# Patient Record
Sex: Female | Born: 1945 | Race: Black or African American | Hispanic: No | Marital: Married
Health system: Southern US, Community
[De-identification: ages and names within clinical notes are randomized; demographics above are authoritative.]

## PROBLEM LIST (undated history)

## (undated) DIAGNOSIS — I10 Essential (primary) hypertension: Secondary | ICD-10-CM

## (undated) DIAGNOSIS — D72819 Decreased white blood cell count, unspecified: Secondary | ICD-10-CM

## (undated) DIAGNOSIS — E039 Hypothyroidism, unspecified: Secondary | ICD-10-CM

## (undated) HISTORY — DX: Hypothyroidism, unspecified: E03.9

## (undated) HISTORY — PX: BREAST EXCISIONAL BIOPSY: SUR124

## (undated) HISTORY — DX: Decreased white blood cell count, unspecified: D72.819

## (undated) HISTORY — DX: Essential (primary) hypertension: I10

---

## 2000-02-23 ENCOUNTER — Encounter: Admission: RE | Admit: 2000-02-23 | Discharge: 2000-02-23 | Payer: Self-pay | Admitting: Obstetrics and Gynecology

## 2000-02-23 ENCOUNTER — Encounter: Payer: Self-pay | Admitting: Obstetrics and Gynecology

## 2000-06-04 ENCOUNTER — Encounter: Payer: Self-pay | Admitting: Internal Medicine

## 2000-06-04 ENCOUNTER — Encounter: Admission: RE | Admit: 2000-06-04 | Discharge: 2000-06-04 | Payer: Self-pay | Admitting: Internal Medicine

## 2001-02-01 ENCOUNTER — Other Ambulatory Visit: Admission: RE | Admit: 2001-02-01 | Discharge: 2001-02-01 | Payer: Self-pay | Admitting: Obstetrics and Gynecology

## 2001-03-08 ENCOUNTER — Encounter: Admission: RE | Admit: 2001-03-08 | Discharge: 2001-03-08 | Payer: Self-pay | Admitting: Obstetrics and Gynecology

## 2001-03-08 ENCOUNTER — Encounter: Payer: Self-pay | Admitting: Obstetrics and Gynecology

## 2001-06-10 ENCOUNTER — Emergency Department (HOSPITAL_COMMUNITY): Admission: EM | Admit: 2001-06-10 | Discharge: 2001-06-11 | Payer: Self-pay | Admitting: Emergency Medicine

## 2001-06-11 ENCOUNTER — Encounter: Payer: Self-pay | Admitting: Emergency Medicine

## 2002-03-09 ENCOUNTER — Encounter: Admission: RE | Admit: 2002-03-09 | Discharge: 2002-03-09 | Payer: Self-pay | Admitting: Internal Medicine

## 2002-03-09 ENCOUNTER — Encounter: Payer: Self-pay | Admitting: Internal Medicine

## 2002-07-21 ENCOUNTER — Encounter: Admission: RE | Admit: 2002-07-21 | Discharge: 2002-07-21 | Payer: Self-pay | Admitting: Obstetrics and Gynecology

## 2002-07-21 ENCOUNTER — Encounter: Payer: Self-pay | Admitting: Obstetrics and Gynecology

## 2002-10-16 ENCOUNTER — Ambulatory Visit (HOSPITAL_COMMUNITY): Admission: RE | Admit: 2002-10-16 | Discharge: 2002-10-16 | Payer: Self-pay | Admitting: Gastroenterology

## 2003-03-14 ENCOUNTER — Encounter: Admission: RE | Admit: 2003-03-14 | Discharge: 2003-03-14 | Payer: Self-pay | Admitting: Internal Medicine

## 2003-03-14 ENCOUNTER — Encounter: Payer: Self-pay | Admitting: Internal Medicine

## 2004-03-28 ENCOUNTER — Encounter: Admission: RE | Admit: 2004-03-28 | Discharge: 2004-03-28 | Payer: Self-pay | Admitting: Obstetrics and Gynecology

## 2004-08-27 ENCOUNTER — Other Ambulatory Visit: Admission: RE | Admit: 2004-08-27 | Discharge: 2004-08-27 | Payer: Self-pay | Admitting: Obstetrics and Gynecology

## 2005-04-13 ENCOUNTER — Encounter: Admission: RE | Admit: 2005-04-13 | Discharge: 2005-04-13 | Payer: Self-pay | Admitting: Obstetrics and Gynecology

## 2005-10-07 ENCOUNTER — Other Ambulatory Visit: Admission: RE | Admit: 2005-10-07 | Discharge: 2005-10-07 | Payer: Self-pay | Admitting: Obstetrics and Gynecology

## 2005-12-24 ENCOUNTER — Other Ambulatory Visit: Admission: RE | Admit: 2005-12-24 | Discharge: 2005-12-24 | Payer: Self-pay | Admitting: Diagnostic Radiology

## 2006-03-26 ENCOUNTER — Ambulatory Visit (HOSPITAL_COMMUNITY): Admission: RE | Admit: 2006-03-26 | Discharge: 2006-03-27 | Payer: Self-pay | Admitting: Surgery

## 2006-03-26 ENCOUNTER — Encounter (INDEPENDENT_AMBULATORY_CARE_PROVIDER_SITE_OTHER): Payer: Self-pay | Admitting: Specialist

## 2006-04-21 ENCOUNTER — Encounter: Admission: RE | Admit: 2006-04-21 | Discharge: 2006-04-21 | Payer: Self-pay | Admitting: Internal Medicine

## 2007-04-05 ENCOUNTER — Other Ambulatory Visit: Admission: RE | Admit: 2007-04-05 | Discharge: 2007-04-05 | Payer: Self-pay | Admitting: Obstetrics and Gynecology

## 2007-04-25 ENCOUNTER — Encounter: Admission: RE | Admit: 2007-04-25 | Discharge: 2007-04-25 | Payer: Self-pay | Admitting: Internal Medicine

## 2008-04-05 ENCOUNTER — Other Ambulatory Visit: Admission: RE | Admit: 2008-04-05 | Discharge: 2008-04-05 | Payer: Self-pay | Admitting: Obstetrics and Gynecology

## 2008-05-28 ENCOUNTER — Encounter: Admission: RE | Admit: 2008-05-28 | Discharge: 2008-05-28 | Payer: Self-pay | Admitting: Internal Medicine

## 2008-07-15 ENCOUNTER — Emergency Department (HOSPITAL_COMMUNITY): Admission: EM | Admit: 2008-07-15 | Discharge: 2008-07-15 | Payer: Self-pay | Admitting: Emergency Medicine

## 2009-04-08 ENCOUNTER — Other Ambulatory Visit: Admission: RE | Admit: 2009-04-08 | Discharge: 2009-04-08 | Payer: Self-pay | Admitting: Obstetrics and Gynecology

## 2009-06-20 ENCOUNTER — Encounter: Admission: RE | Admit: 2009-06-20 | Discharge: 2009-06-20 | Payer: Self-pay | Admitting: Internal Medicine

## 2009-09-17 ENCOUNTER — Encounter: Admission: RE | Admit: 2009-09-17 | Discharge: 2009-09-17 | Payer: Self-pay | Admitting: Surgery

## 2010-04-14 ENCOUNTER — Other Ambulatory Visit: Admission: RE | Admit: 2010-04-14 | Discharge: 2010-04-14 | Payer: Self-pay | Admitting: Obstetrics and Gynecology

## 2010-06-23 ENCOUNTER — Encounter: Admission: RE | Admit: 2010-06-23 | Discharge: 2010-06-23 | Payer: Self-pay | Admitting: Family Medicine

## 2011-01-16 NOTE — Op Note (Signed)
   NAME:  Sharon Stevens, Sharon Stevens                        ACCOUNT NO.:  [id number]   MEDICAL RECORD NO.:  [id number]                   PATIENT TYPE:  AMB   LOCATION:  ENDO                                 FACILITY:  [facility]   PHYSICIAN:  John C. Madilyn Fireman, M.D.                 DATE OF BIRTH:  08-18-1946   DATE OF PROCEDURE:  10/16/2002  DATE OF DISCHARGE:                                 OPERATIVE REPORT   PROCEDURE:  Esophagogastroduodenoscopy with esophageal dilatation.   INDICATION FOR PROCEDURE:  Dysphagia and heme-positive stool with family  history of esophageal cancer in a first-degree relative.  She had a  colonoscopy prior to this procedure which was unrevealing except for  internal hemorrhoids.   DESCRIPTION OF PROCEDURE:  The patient was placed in the left lateral  decubitus position and placed on the pulse monitor with continuous low-flow  oxygen delivered by nasal cannula.  She was sedated with 1 mg IV Versed in  addition to the medicine given for the previous colonoscopy.  The Olympus  video endoscope was advanced under direct vision into the oropharynx and  esophagus.  The esophagus was straight and of normal caliber with the  squamocolumnar line at 38 cm.  There did appear to be a patent but definite  lower esophageal ring which did not present resistance to passage of the  scope beyond.  There was no definite hiatal hernia or visible esophagitis  and no visible suspicion of neoplasm.  The stomach was entered, and a small  amount of liquid secretions were suctioned from the fundus.  Retroflexed  view of the cardia was unremarkable.  The fundus, body, antrum, and pylorus  all appeared normal.  The duodenum was entered, and both the bulb and second  portion were well-inspected and appeared to be within normal limits.  A  Savary guidewire was passed through the endoscope channel and the scope  withdrawn.  A single 16 mm Savary dilator was passed over the guidewire with  minimal  resistance and no blood seen on withdrawal.  The dilator was removed  together with the wire, and the patient returned to the recovery room in  stable condition.  She tolerated the procedure well, and there were no  immediate complications.   IMPRESSION:  1. Lower esophageal ring, status post dilatation.  2. Otherwise normal endoscopy.                                               John C. Madilyn Fireman, M.D.    JCH/MEDQ  D:  10/16/2002  T:  10/16/2002  Job:  [id number]   cc:   Artist Pais, M.D.  [address], [address]  [place]  [place] [address]  Fax: [phone]

## 2011-01-16 NOTE — Op Note (Signed)
   NAME:  Sharon Stevens, Sharon Stevens                        ACCOUNT NO.:  [id number]   MEDICAL RECORD NO.:  [id number]                   PATIENT TYPE:  AMB   LOCATION:  ENDO                                 FACILITY:  [facility]   PHYSICIAN:  John C. Madilyn Fireman, M.D.                 DATE OF BIRTH:  [birth year]   DATE OF PROCEDURE:  10/16/2002  DATE OF DISCHARGE:                                 OPERATIVE REPORT   PROCEDURE:  Colonoscopy.   INDICATIONS FOR PROCEDURE:  Heme positive stool.   DESCRIPTION OF PROCEDURE:  The patient was placed in the left lateral  decubitus position and placed on the pulse monitor with continuous low flow  oxygen delivered by nasal cannula. She was sedated with 75 micrograms of IV  Demerol and 7 mg of IV Versed.   The Olympus video colonoscope was inserted into the rectum and advanced to  the cecum, confirmed by transillumination of McBurney's point and  visualization of the ileocecal valve and appendiceal orifice.  The prep was  excellent.   The cecum, ascending , transverse, descending and sigmoid colon all appeared  normal with no masses, polyps, diverticula or other mucosal abnormalities.  The rectum likewise appeared normal. Retroflexed view of the anus revealed  some small internal hemorrhoids.   The colonoscope was then withdrawn and the patient was returned to the  recovery room in stable condition. She tolerated the procedure well and  there were no immediate complications.   IMPRESSION:  Internal hemorrhoids, otherwise normal colonoscopy.   PLAN:  Based on the heme positive stools and family history of esophageal  cancer as well as dysphagia, will proceed with EGD.                                                  John C. Madilyn Fireman, M.D.    JCH/MEDQ  D:  10/16/2002  T:  10/16/2002  Job:  [id number]   cc:   Artist Pais, M.D.  [address], [address]  [place]  [place] [address]  Fax: [phone]

## 2011-01-16 NOTE — Op Note (Signed)
NAME:  Sharon Stevens, Sharon Stevens              ACCOUNT NO.:  [id number]   MEDICAL RECORD NO.:  [id number]          PATIENT TYPE:  OIB   LOCATION:  1613                         FACILITY:  [facility]   PHYSICIAN:  Velora Heckler, MD      DATE OF BIRTH:  [birth year]   DATE OF PROCEDURE:  03/26/2006  DATE OF DISCHARGE:                                 OPERATIVE REPORT   PREOPERATIVE DIAGNOSIS:  Right thyroid nodule with cytologic atypia   POSTOPERATIVE DIAGNOSIS:  Right thyroid nodule with cytologic atypia.   PROCEDURE:  Right thyroid lobectomy.   SURGEON:  Velora Heckler, MD, FACS   ASSISTANT:  Ollen Gross. Eudelia Bunch., MD, FACS   ANESTHESIA:  General per Dr. Sherrian Divers.   ESTIMATED BLOOD LOSS:  Minimal.   PREPARATION:  Betadine.   COMPLICATIONS:  None.   INDICATIONS:  The patient is a 65 year old female from [place], [place] who presents at the request of Dr. Dorisann Frames for right thyroid  nodule with cytologic atypia.  A nodule been identified on routine physical  exam by Dr. Artist Pais in February 2007.  The patient underwent  ultrasound demonstrating a 3.2 cm solid nodule.  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  The patient now comes to  surgery for resection for a definitive diagnosis.   DESCRIPTION OF PROCEDURE:  The patient is brought to OR #6 at the [facility] and placed in a supine position on the operating  room table.  Following the administration of general anesthesia, the patient  is positioned and then prepped and draped in the usual strict aseptic  fashion.  After ascertaining that an adequate level of anesthesia had been  obtained, a Kocher incision is made with a #15 blade.  Dissection was  carried down through the subcutaneous tissues and the platysma.  Hemostasis  is obtained with the electrocautery.  Skin flaps are developed  cephalad and  caudad from the thyroid notch to the sternal notch.  A Mahorner self-  retaining retractor is placed for exposure.  The strap muscles are incised  in the midline.  Initial dissection was begun on the left side.  The strap  muscles are reflected laterally.  Left thyroid lobe is completely exposed.  It appears grossly normal.  On palpation, there are no dominant nor discrete  masses.   Next, we turned our attention to the right lobe.  Again strap muscles are  reflected laterally to the right.  Right thyroid lobe was exposed.  There is  a large centrally located dominant nodule.  The middle vein is divided  between small Ligaclips.  The gland is mobilized with a Barista.  The superior pole was dissected out.  The superior pole vessels are ligated  in continuity with 2-0 silk ties and medium Ligaclips and divided.  The  gland is rolled anteriorly.  Inferior venous tributaries are divided between  medium Ligaclips.  The gland is rolled further onto the trachea.  The  parathyroid tissue was  identified and preserved.  Recurrent nerve was  identified and preserved.  Branches of the inferior thyroid artery are  divided between Ligaclips.  The ligament of Allyson Sabal is transected with the  electrocautery and the gland is mobilized up and across the midline of the  trachea.  The thyroid isthmus was mobilized.  The isthmus is transected at  its junction with the left thyroid lobe between hemostats.  The left lobe is  suture-ligated with 3-0 Vicryl suture ligatures.  The right thyroid lobe and  isthmus is submitted to pathology for frozen section.  This was performed by  Dr. Charlott Rakes.  Dr. Debby Bud notes a follicular lesion.  There is no obvious  sign of malignancy.   The right neck is irrigated with warm saline which is evacuated.  Surgicel  was placed over the area of the recurrent nerve and parathyroid glands.  Good hemostasis is noted.  The strap muscles are reapproximated in  the  midline with interrupted 3-0 Vicryl sutures.  The platysma is closed with  interrupted 3-0 Vicryl sutures.  The skin is closed with a running 4-0  Monocryl subcuticular suture.  The wound is washed and dried and Benzoin and  Steri-Strips are applied.  Sterile dressings are applied.  The patient is  awakened from anesthesia and brought to the recovery room in stable  condition.  The patient tolerated the procedure well.      Velora Heckler, MD  Electronically Signed     TMG/MEDQ  D:  03/26/2006  T:  03/26/2006  Job:  [id number]   cc:   Dorisann Frames, M.D.  Fax: [phone]   Artist Pais, M.D.  Fax: [phone]   Thora Lance, M.D.  Fax: [phone]

## 2011-03-26 ENCOUNTER — Encounter (HOSPITAL_BASED_OUTPATIENT_CLINIC_OR_DEPARTMENT_OTHER): Payer: Medicare Other | Admitting: Oncology

## 2011-03-26 ENCOUNTER — Other Ambulatory Visit: Payer: Self-pay | Admitting: Oncology

## 2011-03-26 DIAGNOSIS — D72819 Decreased white blood cell count, unspecified: Secondary | ICD-10-CM

## 2011-03-26 LAB — CBC WITH DIFFERENTIAL/PLATELET
BASO%: 0.8 % (ref 0.0–2.0)
Basophils Absolute: 0 10*3/uL (ref 0.0–0.1)
EOS%: 3.5 % (ref 0.0–7.0)
HCT: 38.1 % (ref 34.8–46.6)
LYMPH%: 40.3 % (ref 14.0–49.7)
MCH: 26.8 pg (ref 25.1–34.0)
MONO#: 0.3 10*3/uL (ref 0.1–0.9)
NEUT#: 1.1 10*3/uL — ABNORMAL LOW (ref 1.5–6.5)
Platelets: 167 10*3/uL (ref 145–400)
RDW: 15.8 % — ABNORMAL HIGH (ref 11.2–14.5)

## 2011-03-26 LAB — CHCC SMEAR

## 2011-03-26 LAB — COMPREHENSIVE METABOLIC PANEL
Albumin: 4.3 g/dL (ref 3.5–5.2)
BUN: 23 mg/dL (ref 6–23)
CO2: 28 mEq/L (ref 19–32)
Chloride: 104 mEq/L (ref 96–112)
Total Bilirubin: 0.3 mg/dL (ref 0.3–1.2)
Total Protein: 6.1 g/dL (ref 6.0–8.3)

## 2011-04-20 ENCOUNTER — Other Ambulatory Visit: Payer: Self-pay | Admitting: Obstetrics and Gynecology

## 2011-05-21 ENCOUNTER — Other Ambulatory Visit: Payer: Self-pay | Admitting: Family Medicine

## 2011-05-21 DIAGNOSIS — Z1231 Encounter for screening mammogram for malignant neoplasm of breast: Secondary | ICD-10-CM

## 2011-06-02 LAB — POCT CARDIAC MARKERS
CKMB, poc: <1 — ABNORMAL LOW
Myoglobin, poc: 63.8
Myoglobin, poc: 74.8
Troponin i, poc: <0.05

## 2011-06-02 LAB — POCT I-STAT, CHEM 8
BUN: 20
Calcium, Ion: 1.26
Chloride: 103
Hemoglobin: 13.9
Potassium: 3.8
Sodium: 142

## 2011-06-02 LAB — URINE MICROSCOPIC-ADD ON

## 2011-06-02 LAB — URINALYSIS, ROUTINE W REFLEX MICROSCOPIC
Bilirubin Urine: NEGATIVE
Glucose, UA: NEGATIVE

## 2011-06-26 ENCOUNTER — Ambulatory Visit
Admission: RE | Admit: 2011-06-26 | Discharge: 2011-06-26 | Disposition: A | Discharge status: Home / Self Care | Payer: Medicare Other | Source: Ambulatory Visit | Attending: Family Medicine | Admitting: Family Medicine

## 2011-06-26 DIAGNOSIS — Z1231 Encounter for screening mammogram for malignant neoplasm of breast: Secondary | ICD-10-CM

## 2011-08-20 ENCOUNTER — Other Ambulatory Visit: Payer: Self-pay | Admitting: Oncology

## 2011-08-20 ENCOUNTER — Ambulatory Visit (HOSPITAL_BASED_OUTPATIENT_CLINIC_OR_DEPARTMENT_OTHER): Payer: Medicare Other | Admitting: Oncology

## 2011-08-20 ENCOUNTER — Encounter: Payer: Self-pay | Admitting: *Deleted

## 2011-08-20 ENCOUNTER — Other Ambulatory Visit (HOSPITAL_BASED_OUTPATIENT_CLINIC_OR_DEPARTMENT_OTHER): Payer: Medicare Other | Admitting: Lab

## 2011-08-20 VITALS — BP 121/81 | HR 93 | Temp 98.5°F | Wt 149.0 lb

## 2011-08-20 DIAGNOSIS — D72819 Decreased white blood cell count, unspecified: Secondary | ICD-10-CM

## 2011-08-20 LAB — CHCC SMEAR

## 2011-08-20 LAB — CBC WITH DIFFERENTIAL/PLATELET
EOS%: 3.7 % (ref 0.0–7.0)
Eosinophils Absolute: 0.1 10*3/uL (ref 0.0–0.5)
HCT: 38.6 % (ref 34.8–46.6)
HGB: 12.7 g/dL (ref 11.6–15.9)
MCH: 26.5 pg (ref 25.1–34.0)
MONO#: 0.3 10*3/uL (ref 0.1–0.9)
NEUT%: 45.2 % (ref 38.4–76.8)
RBC: 4.79 10*6/uL (ref 3.70–5.45)

## 2011-08-20 NOTE — Progress Notes (Signed)
Hematology and Oncology Follow Up Visit  Sharon Stevens [facility] [id number] August 12, 1946 65 y.o. 08/20/2011 3:17 PM    CC: Dario Guardian, M.D.    Principle Diagnosis: This is a pleasant 65 year old female with the 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   Current therapy: Observation.  Interim History: Sharon Stevens returns today for a follow up visit. she is asymptomatic today.  She had not reported any recurrent sinopulmonary infection.  She had not had any viral infections.  Had not really had any change in her medication.  She has been on hypertension medication, triamterene and hydrochlorothiazide, for at least 10 years.  She had been on Tekturna.  That had been discontinued, but really no new medication had been started.  She had not used any herbal remedies.  Had not had any really major changes in her health.  She continues to be very active, performing activities of daily living without any hindrance or decline.  Medications: I have reviewed the patient's current medications. Current outpatient prescriptions:aspirin 81 MG tablet, Take 81 mg by mouth daily.  , Disp: , Rfl: ;  Multiple Vitamins-Minerals (MULTIVITAMIN PO), Take 1 tablet by mouth daily.  , Disp: , Rfl: ;  triamterene-hydrochlorothiazide (MAXZIDE-25) 37.5-25 MG per tablet, Take 1 tablet by mouth daily.  , Disp: , Rfl: ;  hydrochlorothiazide (HYDRODIURIL) 25 MG tablet, Take 25 mg by mouth daily.  , Disp: , Rfl:   Allergies: No Known Allergies  Past Medical History, Surgical history, Social history, and Family History were reviewed and updated.  Review of Systems: Constitutional:  Negative for fever, chills, night sweats, anorexia, weight loss, pain. Cardiovascular: no chest pain or dyspnea on exertion Respiratory: no cough, shortness of breath, or wheezing Neurological: no TIA or stroke symptoms Dermatological: negative ENT: negative Skin: Negative. Gastrointestinal: no abdominal pain,  change in bowel habits, or black or bloody stools Genito-Urinary: no dysuria, trouble voiding, or hematuria Hematological and Lymphatic: negative Breast: negative Musculoskeletal: negative Remaining ROS negative. Physical Exam: Blood pressure 121/81, pulse 93, temperature 98.5 F (36.9 C), temperature source Oral, weight 149 lb (67.586 kg). ECOG: 0 General appearance: alert Head: Normocephalic, without obvious abnormality, atraumatic Neck: no adenopathy, no carotid bruit, no JVD, supple, symmetrical, trachea midline and thyroid not enlarged, symmetric, no tenderness/mass/nodules Lymph nodes: Cervical, supraclavicular, and axillary nodes normal. Heart:regular rate and rhythm, S1, S2 normal, no murmur, click, rub or gallop Lung:chest clear, no wheezing, rales, normal symmetric air entry Abdomin: soft, non-tender, without masses or organomegaly EXT:no erythema, induration, or nodules   Lab Results: Lab Results  Component Value Date   WBC 2.9* 08/20/2011   HGB 12.7 08/20/2011   HCT 38.6 08/20/2011   MCV 80.6 08/20/2011   PLT 169 08/20/2011     Chemistry      Component Value Date/Time   NA 144 03/26/2011 1331   K 3.8 03/26/2011 1331   CL 104 03/26/2011 1331   CO2 28 03/26/2011 1331   BUN 23 03/26/2011 1331   CREATININE 1.11* 03/26/2011 1331      Component Value Date/Time   CALCIUM 10.5 03/26/2011 1331   ALKPHOS 80 03/26/2011 1331   AST 20 03/26/2011 1331   ALT 16 03/26/2011 1331   BILITOT 0.3 03/26/2011 1331         Impression and Plan: This is a pleasant 65 year old female with the;  1. 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 Her WBC and ANC are better today. There is no need for any further hematology work  up at this time. I do recommend annual CBC with her PCP and if her ANC drops below 900, then we will revaluate her at this time. I think this is likely ethnic variations without any evidence of a hematological disorder.  2. Follow up will  be PRN     Marcianne Ozbun, MD 12/20/20123:17 PM

## 2012-04-25 ENCOUNTER — Other Ambulatory Visit (HOSPITAL_COMMUNITY)
Admission: RE | Admit: 2012-04-25 | Discharge: 2012-04-25 | Disposition: A | Discharge status: Home / Self Care | Payer: Medicare Other | Source: Ambulatory Visit | Attending: Obstetrics and Gynecology | Admitting: Obstetrics and Gynecology

## 2012-04-25 ENCOUNTER — Other Ambulatory Visit: Payer: Self-pay | Admitting: Obstetrics and Gynecology

## 2012-04-25 DIAGNOSIS — Z1151 Encounter for screening for human papillomavirus (HPV): Secondary | ICD-10-CM | POA: Insufficient documentation

## 2012-04-25 DIAGNOSIS — Z01419 Encounter for gynecological examination (general) (routine) without abnormal findings: Secondary | ICD-10-CM | POA: Insufficient documentation

## 2012-06-02 ENCOUNTER — Other Ambulatory Visit: Payer: Self-pay | Admitting: Family Medicine

## 2012-06-02 DIAGNOSIS — Z1231 Encounter for screening mammogram for malignant neoplasm of breast: Secondary | ICD-10-CM

## 2012-06-29 ENCOUNTER — Ambulatory Visit
Admission: RE | Admit: 2012-06-29 | Discharge: 2012-06-29 | Disposition: A | Discharge status: Home / Self Care | Payer: Medicare Other | Source: Ambulatory Visit | Attending: Family Medicine | Admitting: Family Medicine

## 2012-06-29 DIAGNOSIS — Z1231 Encounter for screening mammogram for malignant neoplasm of breast: Secondary | ICD-10-CM

## 2013-06-12 ENCOUNTER — Other Ambulatory Visit: Payer: Self-pay

## 2013-06-12 DIAGNOSIS — Z1231 Encounter for screening mammogram for malignant neoplasm of breast: Secondary | ICD-10-CM

## 2013-06-30 ENCOUNTER — Ambulatory Visit
Admission: RE | Admit: 2013-06-30 | Discharge: 2013-06-30 | Disposition: A | Discharge status: Home / Self Care | Payer: Medicare Other | Source: Ambulatory Visit

## 2013-06-30 DIAGNOSIS — Z1231 Encounter for screening mammogram for malignant neoplasm of breast: Secondary | ICD-10-CM

## 2014-05-18 ENCOUNTER — Other Ambulatory Visit (HOSPITAL_COMMUNITY)
Admission: RE | Admit: 2014-05-18 | Discharge: 2014-05-18 | Disposition: A | Discharge status: Home / Self Care | Payer: Medicare Other | Source: Ambulatory Visit | Attending: Obstetrics and Gynecology | Admitting: Obstetrics and Gynecology

## 2014-05-18 ENCOUNTER — Other Ambulatory Visit: Payer: Self-pay | Admitting: Obstetrics and Gynecology

## 2014-05-18 DIAGNOSIS — Z1151 Encounter for screening for human papillomavirus (HPV): Secondary | ICD-10-CM | POA: Diagnosis present

## 2014-05-18 DIAGNOSIS — Z01419 Encounter for gynecological examination (general) (routine) without abnormal findings: Secondary | ICD-10-CM | POA: Diagnosis present

## 2014-05-22 LAB — CYTOLOGY - PAP

## 2014-06-13 ENCOUNTER — Other Ambulatory Visit: Payer: Self-pay

## 2014-06-13 DIAGNOSIS — Z1239 Encounter for other screening for malignant neoplasm of breast: Secondary | ICD-10-CM

## 2014-07-03 ENCOUNTER — Ambulatory Visit
Admission: RE | Admit: 2014-07-03 | Discharge: 2014-07-03 | Disposition: A | Discharge status: Home / Self Care | Payer: Medicare Other | Source: Ambulatory Visit

## 2014-07-03 ENCOUNTER — Other Ambulatory Visit: Payer: Self-pay

## 2014-07-03 DIAGNOSIS — Z1231 Encounter for screening mammogram for malignant neoplasm of breast: Secondary | ICD-10-CM

## 2015-03-01 ENCOUNTER — Other Ambulatory Visit: Payer: Self-pay | Admitting: Surgery

## 2015-03-05 NOTE — Progress Notes (Signed)
Quick Note:  Please contact patient and notify of benign pathology results.  Shequilla Goodgame M. Vandana Haman, MD, FACS [facility] Office: [phone]   ______ 

## 2015-06-12 ENCOUNTER — Other Ambulatory Visit: Payer: Self-pay

## 2015-06-12 DIAGNOSIS — Z1231 Encounter for screening mammogram for malignant neoplasm of breast: Secondary | ICD-10-CM

## 2015-07-05 ENCOUNTER — Ambulatory Visit
Admission: RE | Admit: 2015-07-05 | Discharge: 2015-07-05 | Disposition: A | Discharge status: Home / Self Care | Payer: BC Managed Care – PPO | Source: Ambulatory Visit

## 2015-07-05 DIAGNOSIS — Z1231 Encounter for screening mammogram for malignant neoplasm of breast: Secondary | ICD-10-CM

## 2016-06-08 ENCOUNTER — Other Ambulatory Visit: Payer: Self-pay | Admitting: Family Medicine

## 2016-06-08 DIAGNOSIS — Z1231 Encounter for screening mammogram for malignant neoplasm of breast: Secondary | ICD-10-CM

## 2016-07-14 ENCOUNTER — Ambulatory Visit
Admission: RE | Admit: 2016-07-14 | Discharge: 2016-07-14 | Disposition: A | Discharge status: Home / Self Care | Payer: Medicare Other | Source: Ambulatory Visit | Attending: Family Medicine | Admitting: Family Medicine

## 2016-07-14 DIAGNOSIS — Z1231 Encounter for screening mammogram for malignant neoplasm of breast: Secondary | ICD-10-CM

## 2016-10-30 ENCOUNTER — Other Ambulatory Visit: Payer: Self-pay | Admitting: Surgery

## 2016-10-30 DIAGNOSIS — M7989 Other specified soft tissue disorders: Secondary | ICD-10-CM

## 2016-11-03 ENCOUNTER — Ambulatory Visit
Admission: RE | Admit: 2016-11-03 | Discharge: 2016-11-03 | Disposition: A | Discharge status: Home / Self Care | Payer: Medicare Other | Source: Ambulatory Visit | Attending: Surgery | Admitting: Surgery

## 2016-11-03 DIAGNOSIS — M7989 Other specified soft tissue disorders: Secondary | ICD-10-CM

## 2017-03-30 ENCOUNTER — Telehealth: Payer: Self-pay | Admitting: Internal Medicine

## 2017-03-30 NOTE — Telephone Encounter (Signed)
Pt called and wanted to know if Dr. Lorin PicketScott would take her on as a new patient. Pt is the daughter of Edsel PetrinDella Warner and the sister of Posey ProntoJudy Warner. Please advise, thank you!  Call pt @ [phone]

## 2017-03-30 NOTE — Telephone Encounter (Signed)
Please advise can we make app?

## 2017-03-31 NOTE — Telephone Encounter (Signed)
Ok.  (inform her will be scheduled out) - just to confirm ok with waiting.

## 2017-03-31 NOTE — Telephone Encounter (Signed)
Lm to sched appt

## 2017-06-07 ENCOUNTER — Other Ambulatory Visit: Payer: Self-pay | Admitting: Family Medicine

## 2017-06-07 DIAGNOSIS — Z1231 Encounter for screening mammogram for malignant neoplasm of breast: Secondary | ICD-10-CM

## 2017-07-20 ENCOUNTER — Ambulatory Visit
Admission: RE | Admit: 2017-07-20 | Discharge: 2017-07-20 | Disposition: A | Discharge status: Home / Self Care | Payer: Medicare Other | Source: Ambulatory Visit | Attending: Family Medicine | Admitting: Family Medicine

## 2017-07-20 DIAGNOSIS — Z1231 Encounter for screening mammogram for malignant neoplasm of breast: Secondary | ICD-10-CM

## 2017-08-30 ENCOUNTER — Other Ambulatory Visit (HOSPITAL_COMMUNITY)
Admission: RE | Admit: 2017-08-30 | Discharge: 2017-08-30 | Disposition: A | Discharge status: Home / Self Care | Payer: Medicare Other | Source: Ambulatory Visit | Attending: Obstetrics and Gynecology | Admitting: Obstetrics and Gynecology

## 2017-08-30 ENCOUNTER — Other Ambulatory Visit: Payer: Self-pay | Admitting: Obstetrics and Gynecology

## 2017-08-30 DIAGNOSIS — Z124 Encounter for screening for malignant neoplasm of cervix: Secondary | ICD-10-CM | POA: Diagnosis present

## 2017-09-03 LAB — CYTOLOGY - PAP
Diagnosis: NEGATIVE
HPV (WINDOPATH): NOT DETECTED

## 2018-06-17 ENCOUNTER — Other Ambulatory Visit: Payer: Self-pay | Admitting: Internal Medicine

## 2018-06-17 DIAGNOSIS — Z1231 Encounter for screening mammogram for malignant neoplasm of breast: Secondary | ICD-10-CM

## 2018-07-26 ENCOUNTER — Ambulatory Visit
Admission: RE | Admit: 2018-07-26 | Discharge: 2018-07-26 | Disposition: A | Discharge status: Home / Self Care | Payer: Medicare Other | Source: Ambulatory Visit | Attending: Internal Medicine | Admitting: Internal Medicine

## 2018-07-26 DIAGNOSIS — Z1231 Encounter for screening mammogram for malignant neoplasm of breast: Secondary | ICD-10-CM

## 2019-06-16 ENCOUNTER — Other Ambulatory Visit: Payer: Self-pay | Admitting: Internal Medicine

## 2019-06-16 DIAGNOSIS — Z1231 Encounter for screening mammogram for malignant neoplasm of breast: Secondary | ICD-10-CM

## 2019-08-08 ENCOUNTER — Other Ambulatory Visit: Payer: Self-pay

## 2019-08-08 ENCOUNTER — Ambulatory Visit
Admission: RE | Admit: 2019-08-08 | Discharge: 2019-08-08 | Disposition: A | Discharge status: Home / Self Care | Payer: Medicare Other | Source: Ambulatory Visit | Attending: Internal Medicine | Admitting: Internal Medicine

## 2019-08-08 DIAGNOSIS — Z1231 Encounter for screening mammogram for malignant neoplasm of breast: Secondary | ICD-10-CM

## 2019-10-13 ENCOUNTER — Ambulatory Visit: Payer: Medicare PPO | Attending: Internal Medicine

## 2019-10-13 DIAGNOSIS — Z23 Encounter for immunization: Secondary | ICD-10-CM | POA: Insufficient documentation

## 2019-10-13 NOTE — Progress Notes (Signed)
   Covid-19 Vaccination Clinic  Name:  Sharon Stevens [facility]    MRN: [id number] DOB: Sep 06, 1945  10/13/2019  Sharon Stevens was observed post Covid-19 immunization for 15 minutes without incidence. She was provided with Vaccine Information Sheet and instruction to access the V-Safe system.   Sharon Stevens was instructed to call 911 with any severe reactions post vaccine: Marland Kitchen Difficulty breathing  . Swelling of your face and throat  . A fast heartbeat  . A bad rash all over your body  . Dizziness and weakness    Immunizations Administered    Name Date Dose VIS Date Route   Pfizer COVID-19 Vaccine 10/13/2019  2:01 PM 0.3 mL 08/11/2019 Intramuscular   Manufacturer: [employer], [employer]   Lot: [id number]   NDC: [address]-2

## 2019-11-05 ENCOUNTER — Ambulatory Visit: Payer: Medicare PPO | Attending: Internal Medicine

## 2019-11-05 DIAGNOSIS — Z23 Encounter for immunization: Secondary | ICD-10-CM | POA: Insufficient documentation

## 2019-11-05 NOTE — Progress Notes (Signed)
   Covid-19 Vaccination Clinic  Name:  Sharon Stevens [facility]    MRN: [id number] DOB: October 16, 1945  11/05/2019  [name] was observed post Covid-19 immunization for 30 minutes based on pre-vaccination screening without incident. She was provided with Vaccine Information Sheet and instruction to access the V-Safe system.   [name] was instructed to call 911 with any severe reactions post vaccine: Marland Kitchen Difficulty breathing  . Swelling of face and throat  . A fast heartbeat  . A bad rash all over body  . Dizziness and weakness   Immunizations Administered    Name Date Dose VIS Date Route   Pfizer COVID-19 Vaccine 11/05/2019  1:56 PM 0.3 mL 08/11/2019 Intramuscular   Manufacturer: [employer], [employer]   Lot: [id number]   NDC: [address]-1

## 2020-03-28 DIAGNOSIS — E559 Vitamin D deficiency, unspecified: Secondary | ICD-10-CM | POA: Diagnosis not present

## 2020-03-28 DIAGNOSIS — I10 Essential (primary) hypertension: Secondary | ICD-10-CM | POA: Diagnosis not present

## 2020-03-28 DIAGNOSIS — R7309 Other abnormal glucose: Secondary | ICD-10-CM | POA: Diagnosis not present

## 2020-03-28 DIAGNOSIS — Z0000 Encounter for general adult medical examination without abnormal findings: Secondary | ICD-10-CM | POA: Diagnosis not present

## 2020-03-28 DIAGNOSIS — R946 Abnormal results of thyroid function studies: Secondary | ICD-10-CM | POA: Diagnosis not present

## 2020-04-09 DIAGNOSIS — M25461 Effusion, right knee: Secondary | ICD-10-CM | POA: Diagnosis not present

## 2020-04-09 DIAGNOSIS — M25561 Pain in right knee: Secondary | ICD-10-CM | POA: Diagnosis not present

## 2020-04-09 DIAGNOSIS — M79661 Pain in right lower leg: Secondary | ICD-10-CM | POA: Diagnosis not present

## 2020-04-09 DIAGNOSIS — M1711 Unilateral primary osteoarthritis, right knee: Secondary | ICD-10-CM | POA: Diagnosis not present

## 2020-04-11 DIAGNOSIS — M25561 Pain in right knee: Secondary | ICD-10-CM | POA: Diagnosis not present

## 2020-04-11 DIAGNOSIS — M79604 Pain in right leg: Secondary | ICD-10-CM | POA: Diagnosis not present

## 2020-05-17 DIAGNOSIS — Z23 Encounter for immunization: Secondary | ICD-10-CM | POA: Diagnosis not present

## 2020-06-25 DIAGNOSIS — H04123 Dry eye syndrome of bilateral lacrimal glands: Secondary | ICD-10-CM | POA: Diagnosis not present

## 2020-06-25 DIAGNOSIS — Z961 Presence of intraocular lens: Secondary | ICD-10-CM | POA: Diagnosis not present

## 2020-07-03 ENCOUNTER — Other Ambulatory Visit: Payer: Self-pay | Admitting: Family Medicine

## 2020-07-03 DIAGNOSIS — Z1231 Encounter for screening mammogram for malignant neoplasm of breast: Secondary | ICD-10-CM

## 2020-07-11 DIAGNOSIS — E559 Vitamin D deficiency, unspecified: Secondary | ICD-10-CM | POA: Diagnosis not present

## 2020-07-11 DIAGNOSIS — R946 Abnormal results of thyroid function studies: Secondary | ICD-10-CM | POA: Diagnosis not present

## 2020-07-11 DIAGNOSIS — R7309 Other abnormal glucose: Secondary | ICD-10-CM | POA: Diagnosis not present

## 2020-07-11 DIAGNOSIS — Z0000 Encounter for general adult medical examination without abnormal findings: Secondary | ICD-10-CM | POA: Diagnosis not present

## 2020-07-11 DIAGNOSIS — I10 Essential (primary) hypertension: Secondary | ICD-10-CM | POA: Diagnosis not present

## 2020-07-18 DIAGNOSIS — Z0000 Encounter for general adult medical examination without abnormal findings: Secondary | ICD-10-CM | POA: Diagnosis not present

## 2020-07-18 DIAGNOSIS — I10 Essential (primary) hypertension: Secondary | ICD-10-CM | POA: Diagnosis not present

## 2020-07-18 DIAGNOSIS — F418 Other specified anxiety disorders: Secondary | ICD-10-CM | POA: Diagnosis not present

## 2020-07-18 DIAGNOSIS — R946 Abnormal results of thyroid function studies: Secondary | ICD-10-CM | POA: Diagnosis not present

## 2020-07-18 DIAGNOSIS — E559 Vitamin D deficiency, unspecified: Secondary | ICD-10-CM | POA: Diagnosis not present

## 2020-07-18 DIAGNOSIS — D709 Neutropenia, unspecified: Secondary | ICD-10-CM | POA: Diagnosis not present

## 2020-07-18 DIAGNOSIS — R7309 Other abnormal glucose: Secondary | ICD-10-CM | POA: Diagnosis not present

## 2020-07-18 DIAGNOSIS — M1710 Unilateral primary osteoarthritis, unspecified knee: Secondary | ICD-10-CM | POA: Diagnosis not present

## 2020-07-22 ENCOUNTER — Telehealth: Payer: Self-pay | Admitting: Hematology and Oncology

## 2020-07-22 NOTE — Telephone Encounter (Signed)
Scheduled appointment per 11/22 new patient referral. Spoke to patient who is aware of appointment date and time.  

## 2020-07-31 NOTE — Progress Notes (Signed)
[place] NOTE  Patient Care Team: Lavone Orn, MD as PCP - General (Internal Medicine)  CHIEF COMPLAINTS/PURPOSE OF CONSULTATION:  Newly diagnosed neutropenia  HISTORY OF PRESENTING ILLNESS:  Sharon Stevens 74 y.o. female is here because of recent diagnosis of neutropenia. Labs on 07/11/20 showed WBC 2.5, Hg 13.4, platelets 182. She presents to the clinic today for initial evaluation.  She denies any recent illnesses or infections.  She brought her lab reports from 2014.  It appears that she has had longstanding lymphopenia.  She has not had any frequent illnesses or infections.  I reviewed her records extensively and collaborated the history with the patient.  MEDICAL HISTORY:  Past Medical History:  Diagnosis Date  . Hypertension   . Hypothyroidism   . Leukopenia     SURGICAL HISTORY: Past Surgical History:  Procedure Laterality Date  . BREAST EXCISIONAL BIOPSY      SOCIAL HISTORY: Social History   Socioeconomic History  . Marital status: Married    Spouse name: Not on file  . Number of children: Not on file  . Years of education: Not on file  . Highest education level: Not on file  Occupational History  . Not on file  Tobacco Use  . Smoking status: Not on file  Substance and Sexual Activity  . Alcohol use: Not on file  . Drug use: Not on file  . Sexual activity: Not on file  Other Topics Concern  . Not on file  Social History Narrative  . Not on file   Social Determinants of Health   Financial Resource Strain:   . Difficulty of Paying Living Expenses: Not on file  Food Insecurity:   . Worried About Charity fundraiser in the Last Year: Not on file  . Ran Out of Food in the Last Year: Not on file  Transportation Needs:   . Lack of Transportation (Medical): Not on file  . Lack of Transportation (Non-Medical): Not on file  Physical Activity:   . Days of Exercise per Week: Not on file  . Minutes of Exercise per Session:  Not on file  Stress:   . Feeling of Stress : Not on file  Social Connections:   . Frequency of Communication with Friends and Family: Not on file  . Frequency of Social Gatherings with Friends and Family: Not on file  . Attends Religious Services: Not on file  . Active Member of Clubs or Organizations: Not on file  . Attends Archivist Meetings: Not on file  . Marital Status: Not on file  Intimate Partner Violence:   . Fear of Current or Ex-Partner: Not on file  . Emotionally Abused: Not on file  . Physically Abused: Not on file  . Sexually Abused: Not on file    FAMILY HISTORY: No family history on file.  ALLERGIES:  has No Known Allergies.  MEDICATIONS:  Current Outpatient Medications  Medication Sig Dispense Refill  . Cholecalciferol (VITAMIN D3) 50 MCG (2000 UT) capsule Take 1 capsule (2,000 Units total) by mouth daily.    . hydrochlorothiazide (HYDRODIURIL) 25 MG tablet Take 25 mg by mouth daily.      Marland Kitchen triamterene-hydrochlorothiazide (MAXZIDE-25) 37.5-25 MG per tablet Take 1 tablet by mouth daily.       No current facility-administered medications for this visit.    REVIEW OF SYSTEMS:   Constitutional: Severe fatigue, wheelchair-bound Stools are not black anymore. All other systems were reviewed with the patient and are  negative.  PHYSICAL EXAMINATION: ECOG PERFORMANCE STATUS: 2 - Symptomatic, <50% confined to bed  Vitals:   08/01/20 1317  BP: (!) 149/71  Pulse: 90  Resp: 18  Temp: 98.3 F (36.8 C)  SpO2: 98%   Filed Weights   08/01/20 1317  Weight: 150 lb 11.2 oz (68.4 kg)    GENERAL:alert, no distress and comfortable SKIN: skin color, texture, turgor are normal, no rashes or significant lesions EYES: normal, conjunctiva are pink and non-injected, sclera clear OROPHARYNX:no exudate, no erythema and lips, buccal mucosa, and tongue normal  NECK: supple, thyroid normal size, non-tender, without nodularity LYMPH:  no palpable lymphadenopathy in  the cervical, axillary or inguinal LUNGS: clear to auscultation and percussion with normal breathing effort HEART: regular rate & rhythm and no murmurs and no lower extremity edema ABDOMEN:abdomen soft, non-tender and normal bowel sounds Musculoskeletal:no cyanosis of digits and no clubbing  PSYCH: alert & oriented x 3 with fluent speech NEURO: no focal motor/sensory deficits  LABORATORY DATA:  I have reviewed the data as listed Lab Results  Component Value Date   WBC 2.5 (L) 08/01/2020   HGB 13.1 08/01/2020   HCT 41.7 08/01/2020   MCV 83.2 08/01/2020   PLT 179 08/01/2020   Lab Results  Component Value Date   NA 144 03/26/2011   K 3.8 03/26/2011   CL 104 03/26/2011   CO2 28 03/26/2011    RADIOGRAPHIC STUDIES: I have personally reviewed the radiological reports and agreed with the findings in the report.  ASSESSMENT AND PLAN:  Leukopenia Leukopenia: Primarily neutropenia:  Lab review: 2014: 3.4 2016: 2.2 2017: 2.9 2018: 2.4 2019: 2.5 2021: 2.5, ANC 0.8  Differential diagnosis: 1. Medications 2. viral illnesses 3. Autoimmune conditions like lupus 4. G-71 or folic acid deficiencies 5. Bone marrow disorders 6. Cyclical neutropenia 7. Ethnicity related    Recommendation: 1. Recheck CBC with differential 2. T-95 and folic acid levels 3. ANA    I will call her and discuss results of blood work tomorrow. If her ANC drops below 0.5 or if she becomes anemic then we will need to do a bone marrow biopsy. Patient has not had any infections over the past 10 years    All questions were answered. The patient knows to call the clinic with any problems, questions or concerns.   Rulon Eisenmenger, MD, MPH 08/01/2020    I, Molly Dorshimer, am acting as scribe for Nicholas Lose, MD.  I have reviewed the above documentation for accuracy and completeness, and I agree with the above.

## 2020-08-01 ENCOUNTER — Inpatient Hospital Stay: Payer: Medicare PPO

## 2020-08-01 ENCOUNTER — Other Ambulatory Visit: Payer: Self-pay

## 2020-08-01 ENCOUNTER — Inpatient Hospital Stay: Payer: Medicare PPO | Attending: Hematology and Oncology | Admitting: Hematology and Oncology

## 2020-08-01 VITALS — BP 149/71 | HR 90 | Temp 98.3°F | Resp 18 | Wt 150.7 lb

## 2020-08-01 DIAGNOSIS — E039 Hypothyroidism, unspecified: Secondary | ICD-10-CM | POA: Insufficient documentation

## 2020-08-01 DIAGNOSIS — E538 Deficiency of other specified B group vitamins: Secondary | ICD-10-CM | POA: Diagnosis not present

## 2020-08-01 DIAGNOSIS — D72819 Decreased white blood cell count, unspecified: Secondary | ICD-10-CM | POA: Insufficient documentation

## 2020-08-01 DIAGNOSIS — I10 Essential (primary) hypertension: Secondary | ICD-10-CM | POA: Diagnosis not present

## 2020-08-01 DIAGNOSIS — D709 Neutropenia, unspecified: Secondary | ICD-10-CM

## 2020-08-01 DIAGNOSIS — D700 Congenital agranulocytosis: Secondary | ICD-10-CM

## 2020-08-01 DIAGNOSIS — M329 Systemic lupus erythematosus, unspecified: Secondary | ICD-10-CM | POA: Diagnosis not present

## 2020-08-01 LAB — CBC WITH DIFFERENTIAL (CANCER CENTER ONLY)
Abs Immature Granulocytes: 0.01 10*3/uL (ref 0.00–0.07)
Basophils Absolute: 0 10*3/uL (ref 0.0–0.1)
Basophils Relative: 2 %
Eosinophils Absolute: 0.1 10*3/uL (ref 0.0–0.5)
Eosinophils Relative: 5 %
HCT: 41.7 % (ref 36.0–46.0)
Hemoglobin: 13.1 g/dL (ref 12.0–15.0)
Immature Granulocytes: 0 %
Lymphocytes Relative: 36 %
Lymphs Abs: 0.9 10*3/uL (ref 0.7–4.0)
MCH: 26.1 pg (ref 26.0–34.0)
MCHC: 31.4 g/dL (ref 30.0–36.0)
MCV: 83.2 fL (ref 80.0–100.0)
Monocytes Absolute: 0.4 10*3/uL (ref 0.1–1.0)
Monocytes Relative: 14 %
Neutro Abs: 1.1 10*3/uL — ABNORMAL LOW (ref 1.7–7.7)
Neutrophils Relative %: 43 %
Platelet Count: 179 10*3/uL (ref 150–400)
RBC: 5.01 MIL/uL (ref 3.87–5.11)
RDW: 14.2 % (ref 11.5–15.5)
WBC Count: 2.5 10*3/uL — ABNORMAL LOW (ref 4.0–10.5)
nRBC: 0 % (ref 0.0–0.2)

## 2020-08-01 LAB — FOLATE: Folate: 9.1 ng/mL (ref >5.9)

## 2020-08-01 LAB — VITAMIN B12: Vitamin B-12: 506 pg/mL (ref 180–914)

## 2020-08-01 MED ORDER — VITAMIN D3 50 MCG (2000 UT) PO CAPS: 2000.0000 [IU] | ORAL_CAPSULE | Freq: Every day | ORAL | Status: AC

## 2020-08-01 NOTE — Assessment & Plan Note (Addendum)
Leukopenia: Primarily neutropenia:  Lab review: 2014: 3.4 2016: 2.2 2017: 2.9 2018: 2.4 2019: 2.5 2021: 2.5, ANC 0.8  Differential diagnosis: 1. Medications 2. viral illnesses 3. Autoimmune conditions like lupus 4. T-47 or folic acid deficiencies 5. Bone marrow disorders 6. Cyclical neutropenia 7. Ethnicity related    Recommendation: 1. Recheck CBC with differential 2. M-76 and folic acid levels 3. ANA    I will call her and discuss results of blood work tomorrow. If her ANC drops below 0.5 or if she becomes anemic then we will need to do a bone marrow biopsy. Patient has not had any infections over the past 10 years

## 2020-08-01 NOTE — Assessment & Plan Note (Signed)
Leukopenia: Primarily neutropenia:  Lab review: 2014: 3.4 2016: 2.2 2017: 2.9 2018: 2.4 2019: 2.5 2021: 2.5, ANC 0.8  Differential diagnosis: 1. Medications 2. viral illnesses 3. Autoimmune conditions like lupus 4. B-12 or folic acid deficiencies 5. Bone marrow disorders 6. Cyclical neutropenia 7. Ethnicity related    Recommendation: 1. Recheck CBC with differential 2. B-12 and folic acid levels 3. ANA    I will call her and discuss results of blood work tomorrow. If her ANC drops below 0.5 or if she becomes anemic then we will need to do a bone marrow biopsy. Patient has not had any infections over the past 10 years       

## 2020-08-01 NOTE — Progress Notes (Signed)
  HEMATOLOGY-ONCOLOGY TELEPHONE VISIT PROGRESS NOTE  I connected with Sharon Stevens on 08/02/2020 at 12:15 PM EST by telephone and verified that I am speaking with the correct person using two identifiers.  I discussed the limitations, risks, security and privacy concerns of performing an evaluation and management service by telephone and the availability of in person appointments.  I also discussed with the patient that there may be a patient responsible charge related to this service. The patient expressed understanding and agreed to proceed.   History of Present Illness: Sharon Stevens is a 74 y.o. female with above-mentioned history of neutropenia. Labs on 08/01/20 showed WBC 2.5, ANC 1.1, folate 9.1, B-12 506. She presents over the phone today for follow-up.     Assessment Plan:  Leukopenia Leukopenia: Primarily neutropenia:  Lab review: 2014: 3.4 2016: 2.2 2017: 2.9 2018: 2.4 2019: 2.5 2021: 2.5, ANC 0.8  Differential diagnosis: 1. Medications 2. viral illnesses 3. Autoimmune conditions like lupus 4. T-91 or folic acid deficiencies 5. Bone marrow disorders 6. Cyclical neutropenia 7. Ethnicity related    Lab review: WBC 2.5, hemoglobin 13.1, platelets 504, ANC 1.1, folic acid 9.1, H36 438  If her ANC drops below 0.5 or if she becomes anemic then we will need to do a bone marrow biopsy. Patient has not had any infections over the past 10 years   I discussed the assessment and treatment plan with the patient. The patient was provided an opportunity to ask questions and all were answered. The patient agreed with the plan and demonstrated an understanding of the instructions. The patient was advised to call back or seek an in-person evaluation if the symptoms worsen or if the condition fails to improve as anticipated.   I provided 12 minutes of non-face-to-face time during this encounter.   Rulon Eisenmenger, MD 08/02/2020    I, Molly Dorshimer, am acting as scribe  for Nicholas Lose, MD.  I have reviewed the above documentation for accuracy and completeness, and I agree with the above.

## 2020-08-02 ENCOUNTER — Inpatient Hospital Stay (HOSPITAL_BASED_OUTPATIENT_CLINIC_OR_DEPARTMENT_OTHER): Payer: Medicare PPO | Admitting: Hematology and Oncology

## 2020-08-02 DIAGNOSIS — D72819 Decreased white blood cell count, unspecified: Secondary | ICD-10-CM

## 2020-08-13 ENCOUNTER — Ambulatory Visit
Admission: RE | Admit: 2020-08-13 | Discharge: 2020-08-13 | Disposition: A | Discharge status: Home / Self Care | Payer: Medicare PPO | Source: Ambulatory Visit | Attending: Family Medicine | Admitting: Family Medicine

## 2020-08-13 ENCOUNTER — Other Ambulatory Visit: Payer: Self-pay

## 2020-08-13 DIAGNOSIS — Z1231 Encounter for screening mammogram for malignant neoplasm of breast: Secondary | ICD-10-CM | POA: Diagnosis not present

## 2020-09-26 DIAGNOSIS — Z01419 Encounter for gynecological examination (general) (routine) without abnormal findings: Secondary | ICD-10-CM | POA: Diagnosis not present

## 2021-03-28 DIAGNOSIS — I10 Essential (primary) hypertension: Secondary | ICD-10-CM | POA: Diagnosis not present

## 2021-03-28 DIAGNOSIS — M549 Dorsalgia, unspecified: Secondary | ICD-10-CM | POA: Diagnosis not present

## 2021-03-28 DIAGNOSIS — F419 Anxiety disorder, unspecified: Secondary | ICD-10-CM | POA: Diagnosis not present

## 2021-03-28 DIAGNOSIS — M48061 Spinal stenosis, lumbar region without neurogenic claudication: Secondary | ICD-10-CM | POA: Diagnosis not present

## 2021-03-31 ENCOUNTER — Other Ambulatory Visit: Payer: Self-pay | Admitting: Family Medicine

## 2021-03-31 DIAGNOSIS — M545 Low back pain, unspecified: Secondary | ICD-10-CM

## 2021-03-31 DIAGNOSIS — M48061 Spinal stenosis, lumbar region without neurogenic claudication: Secondary | ICD-10-CM

## 2021-04-22 ENCOUNTER — Other Ambulatory Visit: Payer: Medicare PPO

## 2021-05-02 ENCOUNTER — Ambulatory Visit
Admission: RE | Admit: 2021-05-02 | Discharge: 2021-05-02 | Disposition: A | Discharge status: Home / Self Care | Payer: Medicare PPO | Source: Ambulatory Visit | Attending: Family Medicine | Admitting: Family Medicine

## 2021-05-02 DIAGNOSIS — M545 Low back pain, unspecified: Secondary | ICD-10-CM

## 2021-05-02 DIAGNOSIS — M48061 Spinal stenosis, lumbar region without neurogenic claudication: Secondary | ICD-10-CM

## 2021-06-06 DIAGNOSIS — M5451 Vertebrogenic low back pain: Secondary | ICD-10-CM | POA: Diagnosis not present

## 2021-06-10 DIAGNOSIS — D2272 Melanocytic nevi of left lower limb, including hip: Secondary | ICD-10-CM | POA: Diagnosis not present

## 2021-06-10 DIAGNOSIS — D485 Neoplasm of uncertain behavior of skin: Secondary | ICD-10-CM | POA: Diagnosis not present

## 2021-06-10 DIAGNOSIS — L820 Inflamed seborrheic keratosis: Secondary | ICD-10-CM | POA: Diagnosis not present

## 2021-06-10 DIAGNOSIS — D2262 Melanocytic nevi of left upper limb, including shoulder: Secondary | ICD-10-CM | POA: Diagnosis not present

## 2021-07-02 DIAGNOSIS — H04123 Dry eye syndrome of bilateral lacrimal glands: Secondary | ICD-10-CM | POA: Diagnosis not present

## 2021-07-02 DIAGNOSIS — Z961 Presence of intraocular lens: Secondary | ICD-10-CM | POA: Diagnosis not present

## 2021-07-03 DIAGNOSIS — M5451 Vertebrogenic low back pain: Secondary | ICD-10-CM | POA: Diagnosis not present

## 2021-07-10 ENCOUNTER — Other Ambulatory Visit: Payer: Self-pay | Admitting: Family Medicine

## 2021-07-10 DIAGNOSIS — Z1231 Encounter for screening mammogram for malignant neoplasm of breast: Secondary | ICD-10-CM

## 2021-07-15 DIAGNOSIS — E559 Vitamin D deficiency, unspecified: Secondary | ICD-10-CM | POA: Diagnosis not present

## 2021-07-15 DIAGNOSIS — R7309 Other abnormal glucose: Secondary | ICD-10-CM | POA: Diagnosis not present

## 2021-07-15 DIAGNOSIS — R946 Abnormal results of thyroid function studies: Secondary | ICD-10-CM | POA: Diagnosis not present

## 2021-07-15 DIAGNOSIS — Z0000 Encounter for general adult medical examination without abnormal findings: Secondary | ICD-10-CM | POA: Diagnosis not present

## 2021-07-15 DIAGNOSIS — M5451 Vertebrogenic low back pain: Secondary | ICD-10-CM | POA: Diagnosis not present

## 2021-07-17 DIAGNOSIS — M5451 Vertebrogenic low back pain: Secondary | ICD-10-CM | POA: Diagnosis not present

## 2021-07-21 DIAGNOSIS — M5451 Vertebrogenic low back pain: Secondary | ICD-10-CM | POA: Diagnosis not present

## 2021-07-22 DIAGNOSIS — I10 Essential (primary) hypertension: Secondary | ICD-10-CM | POA: Diagnosis not present

## 2021-07-22 DIAGNOSIS — E7800 Pure hypercholesterolemia, unspecified: Secondary | ICD-10-CM | POA: Diagnosis not present

## 2021-07-22 DIAGNOSIS — M48062 Spinal stenosis, lumbar region with neurogenic claudication: Secondary | ICD-10-CM | POA: Diagnosis not present

## 2021-07-22 DIAGNOSIS — R946 Abnormal results of thyroid function studies: Secondary | ICD-10-CM | POA: Diagnosis not present

## 2021-07-22 DIAGNOSIS — D709 Neutropenia, unspecified: Secondary | ICD-10-CM | POA: Diagnosis not present

## 2021-07-22 DIAGNOSIS — R7309 Other abnormal glucose: Secondary | ICD-10-CM | POA: Diagnosis not present

## 2021-07-22 DIAGNOSIS — E559 Vitamin D deficiency, unspecified: Secondary | ICD-10-CM | POA: Diagnosis not present

## 2021-07-22 DIAGNOSIS — Z0000 Encounter for general adult medical examination without abnormal findings: Secondary | ICD-10-CM | POA: Diagnosis not present

## 2021-07-22 DIAGNOSIS — M1710 Unilateral primary osteoarthritis, unspecified knee: Secondary | ICD-10-CM | POA: Diagnosis not present

## 2021-07-29 DIAGNOSIS — M5451 Vertebrogenic low back pain: Secondary | ICD-10-CM | POA: Diagnosis not present

## 2021-08-13 DIAGNOSIS — M5451 Vertebrogenic low back pain: Secondary | ICD-10-CM | POA: Diagnosis not present

## 2021-08-14 ENCOUNTER — Ambulatory Visit
Admission: RE | Admit: 2021-08-14 | Discharge: 2021-08-14 | Disposition: A | Discharge status: Home / Self Care | Payer: Medicare PPO | Source: Ambulatory Visit | Attending: Family Medicine | Admitting: Family Medicine

## 2021-08-14 DIAGNOSIS — Z1231 Encounter for screening mammogram for malignant neoplasm of breast: Secondary | ICD-10-CM

## 2021-09-20 IMAGING — MG DIGITAL SCREENING BILAT W/ TOMO W/ CAD
8 series · 8 of 24 positions shown · non-contrast
Comparison: Previous exam(s).

CLINICAL DATA: Screening.

EXAM:
DIGITAL SCREENING BILATERAL MAMMOGRAM WITH TOMO AND CAD

[R MLO synth-2D]
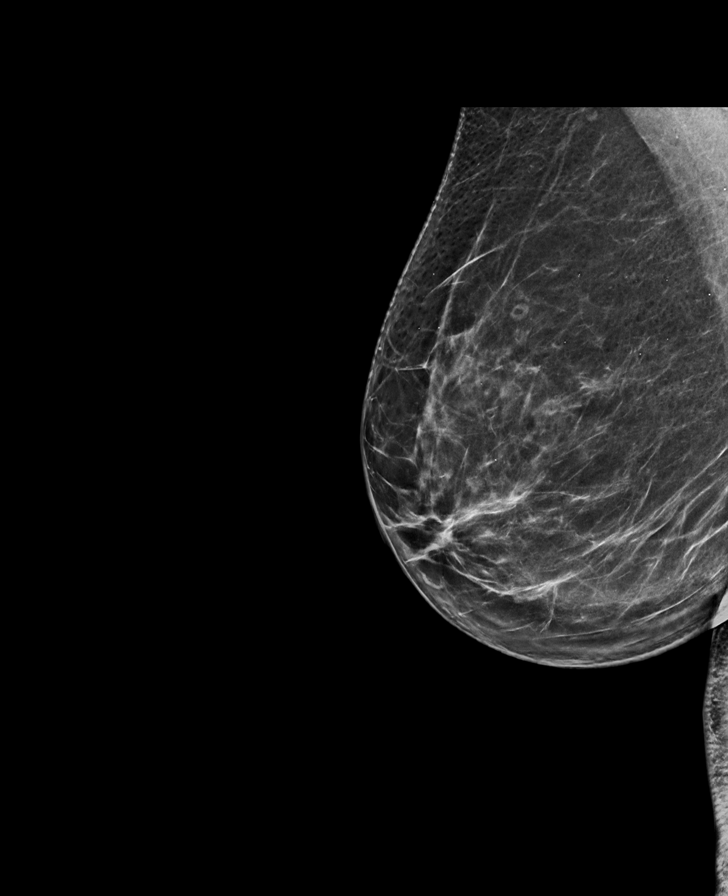

[L MLO synth-2D]
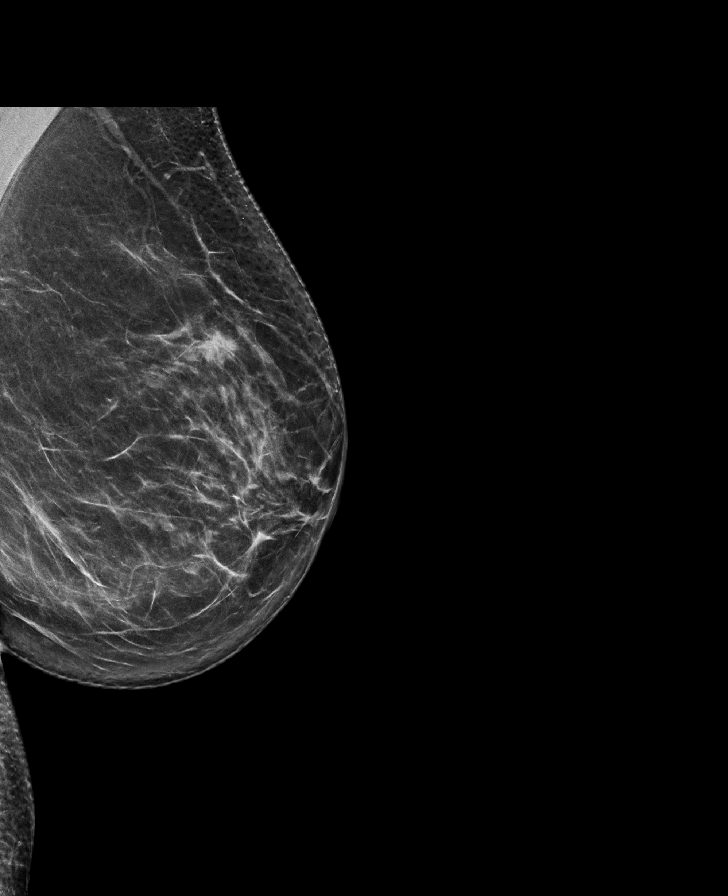

[R CC synth-2D]
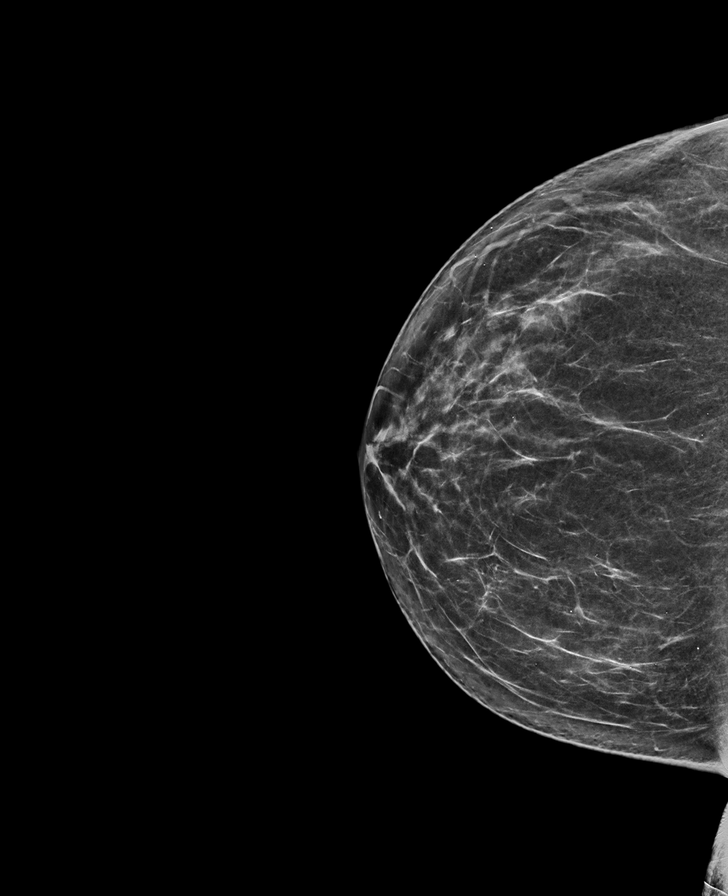

[L CC synth-2D]
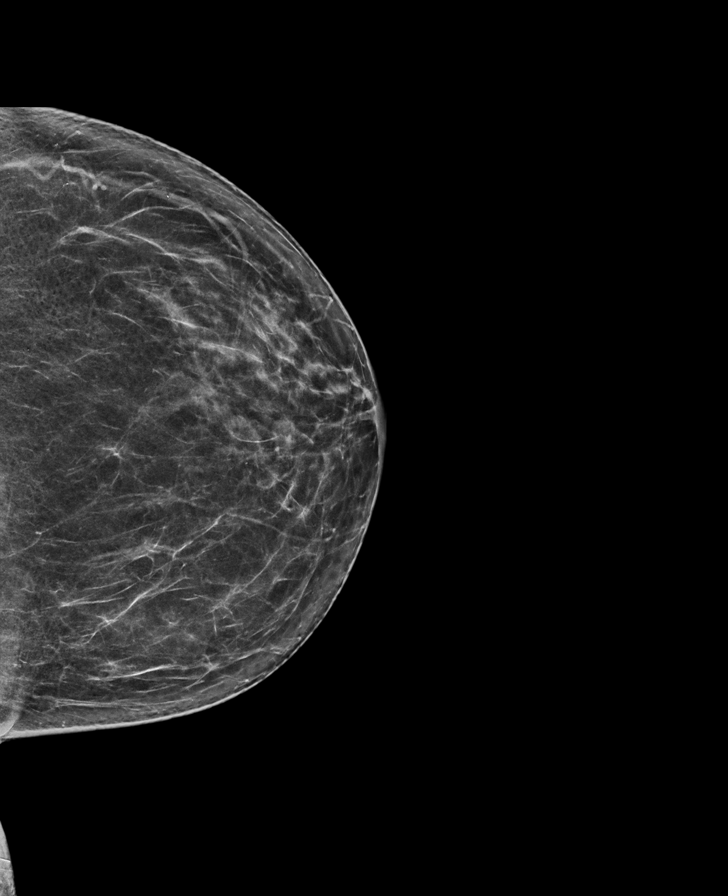

[R CC tomo · tomo slice 38/75.0]
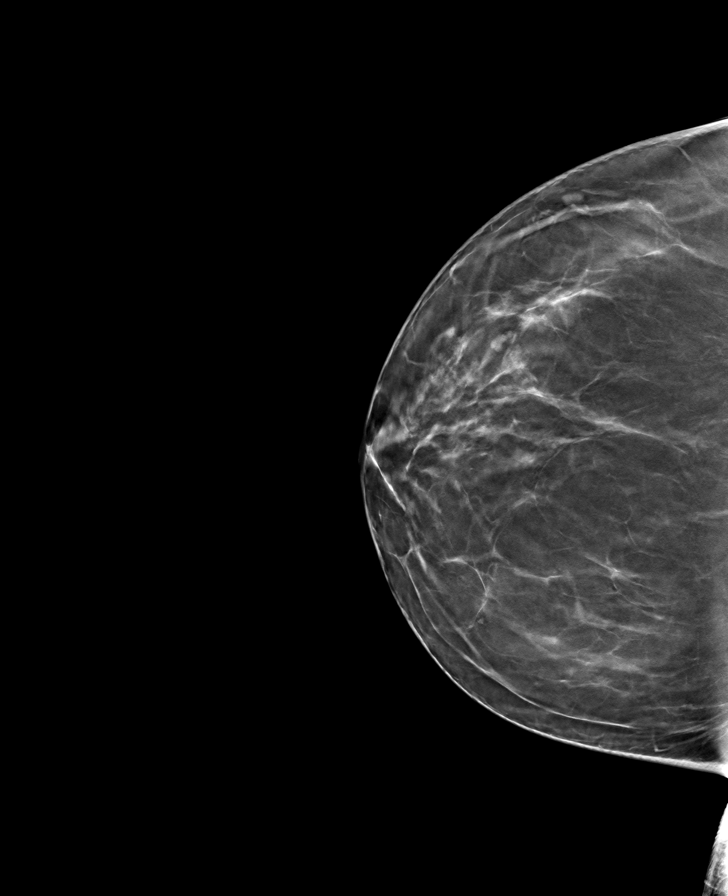

[L MLO tomo · tomo slice 39/77.0]
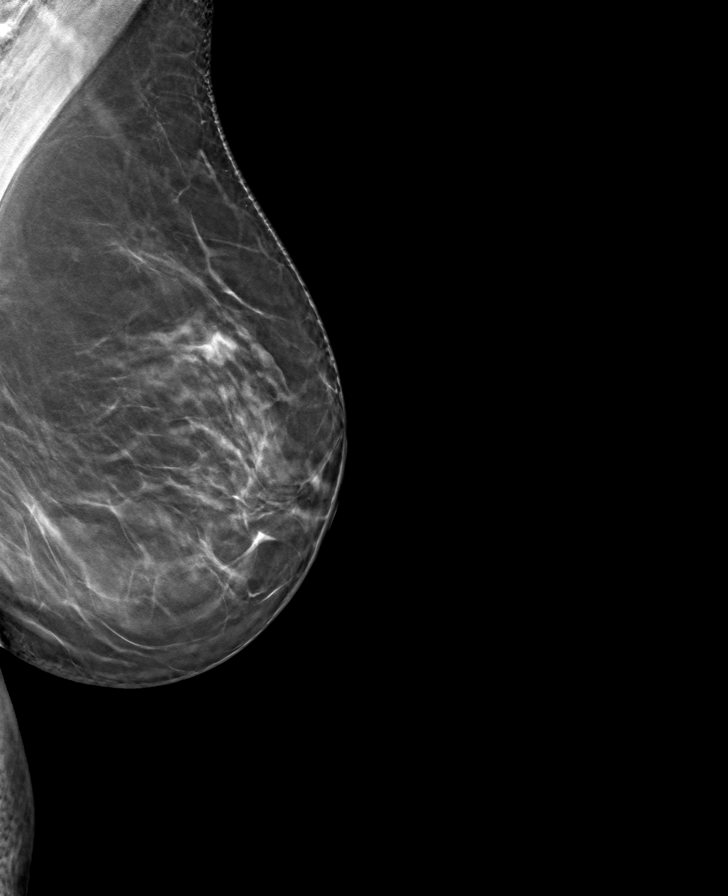

[R MLO tomo · tomo slice 41/81.0]
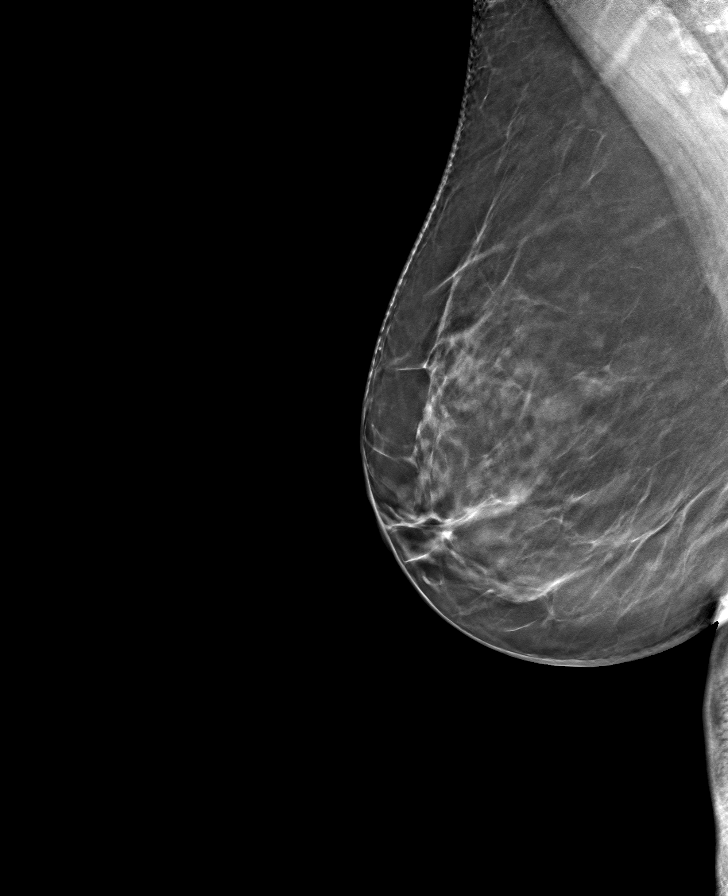

[L CC tomo · tomo slice 36/71.0]
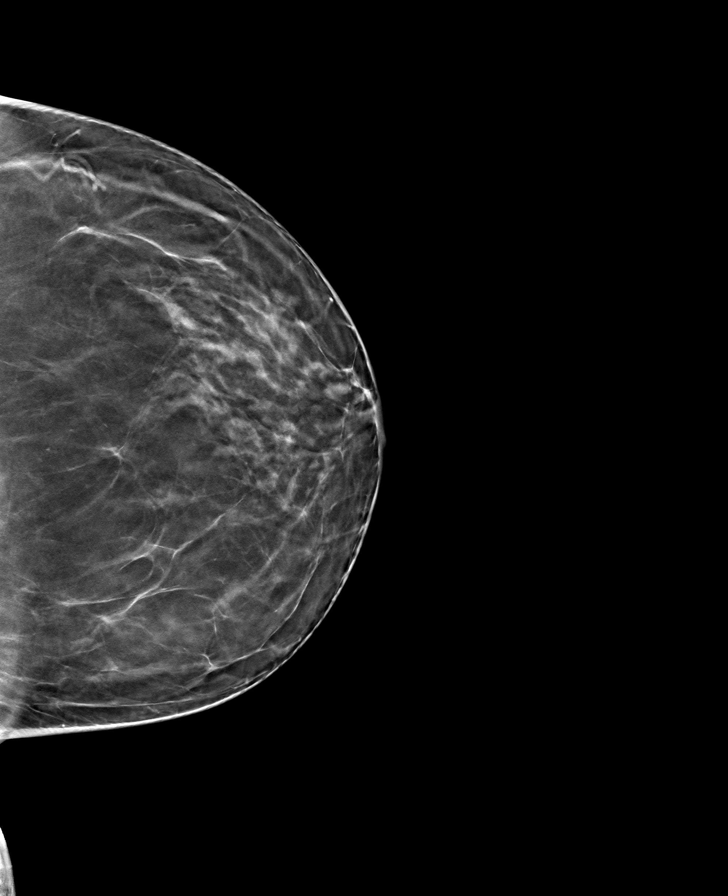

[8 of 24 positions shown; findings below may reference images not displayed]

ACR Breast Density Category b: There are scattered areas of
fibroglandular density.
FINDINGS: There are no findings suspicious for malignancy. Images were
processed with CAD.
IMPRESSION: No mammographic evidence of malignancy. A result letter of this
screening mammogram will be mailed directly to the patient.

RECOMMENDATION:
Screening mammogram in one year. (Code:[id number])

BI-RADS CATEGORY  1: Negative.

## 2021-09-30 DIAGNOSIS — Z01419 Encounter for gynecological examination (general) (routine) without abnormal findings: Secondary | ICD-10-CM | POA: Diagnosis not present

## 2022-01-12 DIAGNOSIS — Z03818 Encounter for observation for suspected exposure to other biological agents ruled out: Secondary | ICD-10-CM | POA: Diagnosis not present

## 2022-01-12 DIAGNOSIS — J029 Acute pharyngitis, unspecified: Secondary | ICD-10-CM | POA: Diagnosis not present

## 2022-04-28 DIAGNOSIS — M79671 Pain in right foot: Secondary | ICD-10-CM | POA: Diagnosis not present

## 2022-06-11 DIAGNOSIS — D2261 Melanocytic nevi of right upper limb, including shoulder: Secondary | ICD-10-CM | POA: Diagnosis not present

## 2022-06-11 DIAGNOSIS — D220 Melanocytic nevi of lip: Secondary | ICD-10-CM | POA: Diagnosis not present

## 2022-06-11 DIAGNOSIS — D2262 Melanocytic nevi of left upper limb, including shoulder: Secondary | ICD-10-CM | POA: Diagnosis not present

## 2022-06-11 DIAGNOSIS — D2272 Melanocytic nevi of left lower limb, including hip: Secondary | ICD-10-CM | POA: Diagnosis not present

## 2022-06-11 DIAGNOSIS — D2239 Melanocytic nevi of other parts of face: Secondary | ICD-10-CM | POA: Diagnosis not present

## 2022-06-11 DIAGNOSIS — L821 Other seborrheic keratosis: Secondary | ICD-10-CM | POA: Diagnosis not present

## 2022-06-11 DIAGNOSIS — L720 Epidermal cyst: Secondary | ICD-10-CM | POA: Diagnosis not present

## 2022-06-11 DIAGNOSIS — D2362 Other benign neoplasm of skin of left upper limb, including shoulder: Secondary | ICD-10-CM | POA: Diagnosis not present

## 2022-06-11 DIAGNOSIS — D2271 Melanocytic nevi of right lower limb, including hip: Secondary | ICD-10-CM | POA: Diagnosis not present

## 2022-06-24 DIAGNOSIS — Z9181 History of falling: Secondary | ICD-10-CM | POA: Diagnosis not present

## 2022-06-24 DIAGNOSIS — E785 Hyperlipidemia, unspecified: Secondary | ICD-10-CM | POA: Diagnosis not present

## 2022-06-24 DIAGNOSIS — Z809 Family history of malignant neoplasm, unspecified: Secondary | ICD-10-CM | POA: Diagnosis not present

## 2022-06-24 DIAGNOSIS — I10 Essential (primary) hypertension: Secondary | ICD-10-CM | POA: Diagnosis not present

## 2022-06-24 DIAGNOSIS — Z8249 Family history of ischemic heart disease and other diseases of the circulatory system: Secondary | ICD-10-CM | POA: Diagnosis not present

## 2022-06-24 DIAGNOSIS — Z888 Allergy status to other drugs, medicaments and biological substances status: Secondary | ICD-10-CM | POA: Diagnosis not present

## 2022-06-24 DIAGNOSIS — M1990 Unspecified osteoarthritis, unspecified site: Secondary | ICD-10-CM | POA: Diagnosis not present

## 2022-07-07 DIAGNOSIS — H04123 Dry eye syndrome of bilateral lacrimal glands: Secondary | ICD-10-CM | POA: Diagnosis not present

## 2022-07-07 DIAGNOSIS — Z961 Presence of intraocular lens: Secondary | ICD-10-CM | POA: Diagnosis not present

## 2022-07-15 ENCOUNTER — Other Ambulatory Visit: Payer: Self-pay | Admitting: Family Medicine

## 2022-07-15 DIAGNOSIS — Z1231 Encounter for screening mammogram for malignant neoplasm of breast: Secondary | ICD-10-CM

## 2022-07-21 DIAGNOSIS — R7309 Other abnormal glucose: Secondary | ICD-10-CM | POA: Diagnosis not present

## 2022-07-21 DIAGNOSIS — L308 Other specified dermatitis: Secondary | ICD-10-CM | POA: Diagnosis not present

## 2022-07-21 DIAGNOSIS — E7800 Pure hypercholesterolemia, unspecified: Secondary | ICD-10-CM | POA: Diagnosis not present

## 2022-07-21 DIAGNOSIS — I10 Essential (primary) hypertension: Secondary | ICD-10-CM | POA: Diagnosis not present

## 2022-07-21 DIAGNOSIS — D709 Neutropenia, unspecified: Secondary | ICD-10-CM | POA: Diagnosis not present

## 2022-07-21 DIAGNOSIS — E559 Vitamin D deficiency, unspecified: Secondary | ICD-10-CM | POA: Diagnosis not present

## 2022-07-21 DIAGNOSIS — Z0000 Encounter for general adult medical examination without abnormal findings: Secondary | ICD-10-CM | POA: Diagnosis not present

## 2022-07-21 DIAGNOSIS — R946 Abnormal results of thyroid function studies: Secondary | ICD-10-CM | POA: Diagnosis not present

## 2022-07-28 DIAGNOSIS — I10 Essential (primary) hypertension: Secondary | ICD-10-CM | POA: Diagnosis not present

## 2022-07-28 DIAGNOSIS — Z0000 Encounter for general adult medical examination without abnormal findings: Secondary | ICD-10-CM | POA: Diagnosis not present

## 2022-07-28 DIAGNOSIS — D709 Neutropenia, unspecified: Secondary | ICD-10-CM | POA: Diagnosis not present

## 2022-07-28 DIAGNOSIS — R946 Abnormal results of thyroid function studies: Secondary | ICD-10-CM | POA: Diagnosis not present

## 2022-07-28 DIAGNOSIS — M48062 Spinal stenosis, lumbar region with neurogenic claudication: Secondary | ICD-10-CM | POA: Diagnosis not present

## 2022-07-28 DIAGNOSIS — E559 Vitamin D deficiency, unspecified: Secondary | ICD-10-CM | POA: Diagnosis not present

## 2022-07-28 DIAGNOSIS — R7309 Other abnormal glucose: Secondary | ICD-10-CM | POA: Diagnosis not present

## 2022-07-28 DIAGNOSIS — E7800 Pure hypercholesterolemia, unspecified: Secondary | ICD-10-CM | POA: Diagnosis not present

## 2022-07-28 DIAGNOSIS — H6120 Impacted cerumen, unspecified ear: Secondary | ICD-10-CM | POA: Diagnosis not present

## 2022-09-01 ENCOUNTER — Emergency Department (HOSPITAL_BASED_OUTPATIENT_CLINIC_OR_DEPARTMENT_OTHER)
Admission: EM | Admit: 2022-09-01 | Discharge: 2022-09-01 | Disposition: A | Discharge status: Home / Self Care | Payer: Medicare PPO | Attending: Emergency Medicine | Admitting: Emergency Medicine

## 2022-09-01 ENCOUNTER — Encounter (HOSPITAL_BASED_OUTPATIENT_CLINIC_OR_DEPARTMENT_OTHER): Payer: Self-pay | Admitting: Emergency Medicine

## 2022-09-01 ENCOUNTER — Other Ambulatory Visit: Payer: Self-pay

## 2022-09-01 DIAGNOSIS — E039 Hypothyroidism, unspecified: Secondary | ICD-10-CM | POA: Insufficient documentation

## 2022-09-01 DIAGNOSIS — Z79899 Other long term (current) drug therapy: Secondary | ICD-10-CM | POA: Insufficient documentation

## 2022-09-01 DIAGNOSIS — Z20822 Contact with and (suspected) exposure to covid-19: Secondary | ICD-10-CM | POA: Insufficient documentation

## 2022-09-01 DIAGNOSIS — J029 Acute pharyngitis, unspecified: Secondary | ICD-10-CM | POA: Insufficient documentation

## 2022-09-01 DIAGNOSIS — I10 Essential (primary) hypertension: Secondary | ICD-10-CM | POA: Insufficient documentation

## 2022-09-01 LAB — RESP PANEL BY RT-PCR (RSV, FLU A&B, COVID)  RVPGX2
Influenza A by PCR: NEGATIVE
Influenza B by PCR: NEGATIVE
Resp Syncytial Virus by PCR: NEGATIVE
SARS Coronavirus 2 by RT PCR: NEGATIVE

## 2022-09-01 LAB — GROUP A STREP BY PCR: Group A Strep by PCR: NOT DETECTED

## 2022-09-01 MED ORDER — LIDOCAINE VISCOUS HCL 2 % MT SOLN: 15.0000 mL | OROMUCOSAL | 0 refills | Status: DC | PRN

## 2022-09-01 MED ORDER — LIDOCAINE VISCOUS HCL 2 % MT SOLN: 15.0000 mL | OROMUCOSAL | 0 refills | Status: AC | PRN

## 2022-09-01 NOTE — ED Provider Notes (Signed)
[facility] EMERGENCY DEPT Provider Note   CSN: [id number] Arrival date & time: 09/01/22  1219     History Chief Complaint  Patient presents with   Sore Throat    Sharon Stevens is a 77 y.o. female patient with history of hypertension and hypothyroidism who presents to the emergency department today for 3-week history of sore throat.  She states that is very sharp and is worse with any oral intake.  She has been doing conservative measures at home with salt water gargles with little relief.  She denies any fever, chills, cough, chest pain, shortness of breath.  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   Sore Throat       Home Medications Prior to Admission medications   Medication Sig Start Date End Date Taking? Authorizing Provider  Cholecalciferol (VITAMIN D3) 50 MCG (2000 UT) capsule Take 1 capsule (2,000 Units total) by mouth daily. 08/01/20   Nicholas Lose, MD  hydrochlorothiazide (HYDRODIURIL) 25 MG tablet Take 25 mg by mouth daily.      [provider]  lidocaine (XYLOCAINE) 2 % solution Use as directed 15 mLs in the mouth or throat as needed for mouth pain. 09/01/22   Myna Bright M, PA-C  triamterene-hydrochlorothiazide (MAXZIDE-25) 37.5-25 MG per tablet Take 1 tablet by mouth daily.      [provider]      Allergies    Lisinopril    Review of Systems   Review of Systems  All other systems reviewed and are negative.   Physical Exam Updated Vital Signs BP (!) 162/66   Pulse 73   Temp 98.9 F (37.2 C)   Resp 18   Ht 5\' 3"  (1.6 m)   Wt 66.7 kg   SpO2 100%   BMI 26.04 kg/m  Physical Exam Vitals and nursing note reviewed.  Constitutional:      Appearance: Normal appearance.  HENT:     Head: Normocephalic and atraumatic.     Mouth/Throat:     Comments: Uvula is midline and nonedematous.  Tonsils are normal without any signs of exudate or hypertrophy.  No evidence  of tonsillar abscess.  There is mild posterior pharyngeal erythema.  Tongue is normal.  Teeth are in normal alignment. Eyes:     General:        Right eye: No discharge.        Left eye: No discharge.     Conjunctiva/sclera: Conjunctivae normal.  Pulmonary:     Effort: Pulmonary effort is normal.  Skin:    General: Skin is warm and dry.     Findings: No rash.  Neurological:     General: No focal deficit present.     Mental Status: She is alert.  Psychiatric:        Mood and Affect: Mood normal.        Behavior: Behavior normal.     ED Results / Procedures / Treatments   Labs (all labs ordered are listed, but only abnormal results are displayed) Labs Reviewed  RESP PANEL BY RT-PCR (RSV, FLU A&B, COVID)  [id number]  GROUP A STREP BY PCR    EKG None  Radiology No results found.  Procedures Procedures    Medications Ordered in ED Medications - No data to display  ED Course/ Medical Decision Making/ A&P  Medical Decision Making Lorianne Malbrough Januszewski is a 77 y.o. female patient who presents to the emergency department today for further evaluation of pharyngitis.  Likely viral.  Patient had respiratory panel and strep swab done in triage.  I interpreted this myself.  All of these were normal.  I will prescribe her viscous Xylocaine solution that she can go home and swish with.  She will continue her conservative measures.  I will have her follow-up with her primary care doctor for further evaluation.  Strict return precautions were discussed.  She safe for discharge at this time.   Risk Prescription drug management.    Final Clinical Impression(s) / ED Diagnoses Final diagnoses:  Pharyngitis, unspecified etiology    Rx / DC Orders ED Discharge Orders          Ordered    lidocaine (XYLOCAINE) 2 % solution  As needed,   Status:  Discontinued        09/01/22 1650    lidocaine (XYLOCAINE) 2 % solution  As needed        09/01/22 1651               Myna Bright [place], [place] 09/01/22 1655    Regan Lemming, MD 09/01/22 [phone]

## 2022-09-01 NOTE — ED Triage Notes (Signed)
Pt arrives to ED with c/o on-going sore throat x3 weeks. Associated symptoms include headache.

## 2022-09-01 NOTE — Discharge Instructions (Signed)
Using your warm salt water gargles several times per day.  Hot teas with honey may also be helpful for you.  I have prescribed Xylocaine solution.  Please take as directed.  I would like you to follow-up with your primary care doctor for further evaluation.  Return to the emergency ferment for any worsening symptoms you might have.

## 2022-09-01 NOTE — ED Notes (Signed)
Pt discharged to home. Discharge instructions have been discussed with patient and/or family members. Pt verbally acknowledges understanding d/c instructions, and endorses comprehension to checkout at registration before leaving.  °

## 2022-09-03 ENCOUNTER — Telehealth: Payer: Self-pay

## 2022-09-03 NOTE — Telephone Encounter (Signed)
     Patient  visit on 1/2  at [facility]  Have you been able to follow up with your primary care physician? Yes   The patient was or was not able to obtain any needed medicine or equipment. Yes   Are there diet recommendations that you are having difficulty following? Na   Patient expresses understanding of discharge instructions and education provided has no other needs at this time.  Yes     Sharon Stevens [facility], [facility], Care Management  [phone] [address], [place], [place] [address] Phone: [phone] Email: [email or web address]    

## 2022-09-10 ENCOUNTER — Ambulatory Visit
Admission: RE | Admit: 2022-09-10 | Discharge: 2022-09-10 | Disposition: A | Discharge status: Home / Self Care | Payer: Medicare PPO | Source: Ambulatory Visit | Attending: Family Medicine | Admitting: Family Medicine

## 2022-09-10 DIAGNOSIS — Z1231 Encounter for screening mammogram for malignant neoplasm of breast: Secondary | ICD-10-CM | POA: Diagnosis not present

## 2023-04-21 DIAGNOSIS — K648 Other hemorrhoids: Secondary | ICD-10-CM | POA: Diagnosis not present

## 2023-04-21 DIAGNOSIS — Z1211 Encounter for screening for malignant neoplasm of colon: Secondary | ICD-10-CM | POA: Diagnosis not present

## 2023-04-21 DIAGNOSIS — K5730 Diverticulosis of large intestine without perforation or abscess without bleeding: Secondary | ICD-10-CM | POA: Diagnosis not present

## 2023-04-21 DIAGNOSIS — Z800 Family history of malignant neoplasm of digestive organs: Secondary | ICD-10-CM | POA: Diagnosis not present

## 2023-04-21 DIAGNOSIS — K635 Polyp of colon: Secondary | ICD-10-CM | POA: Diagnosis not present

## 2023-04-23 DIAGNOSIS — K635 Polyp of colon: Secondary | ICD-10-CM | POA: Diagnosis not present

## 2023-07-14 DIAGNOSIS — H04123 Dry eye syndrome of bilateral lacrimal glands: Secondary | ICD-10-CM | POA: Diagnosis not present

## 2023-07-20 DIAGNOSIS — H6123 Impacted cerumen, bilateral: Secondary | ICD-10-CM | POA: Diagnosis not present

## 2023-07-27 DIAGNOSIS — D709 Neutropenia, unspecified: Secondary | ICD-10-CM | POA: Diagnosis not present

## 2023-07-27 DIAGNOSIS — E559 Vitamin D deficiency, unspecified: Secondary | ICD-10-CM | POA: Diagnosis not present

## 2023-07-27 DIAGNOSIS — I10 Essential (primary) hypertension: Secondary | ICD-10-CM | POA: Diagnosis not present

## 2023-07-27 DIAGNOSIS — E7800 Pure hypercholesterolemia, unspecified: Secondary | ICD-10-CM | POA: Diagnosis not present

## 2023-07-27 DIAGNOSIS — R7309 Other abnormal glucose: Secondary | ICD-10-CM | POA: Diagnosis not present

## 2023-07-27 DIAGNOSIS — R946 Abnormal results of thyroid function studies: Secondary | ICD-10-CM | POA: Diagnosis not present

## 2023-08-03 DIAGNOSIS — F419 Anxiety disorder, unspecified: Secondary | ICD-10-CM | POA: Diagnosis not present

## 2023-08-03 DIAGNOSIS — D709 Neutropenia, unspecified: Secondary | ICD-10-CM | POA: Diagnosis not present

## 2023-08-03 DIAGNOSIS — E559 Vitamin D deficiency, unspecified: Secondary | ICD-10-CM | POA: Diagnosis not present

## 2023-08-03 DIAGNOSIS — R946 Abnormal results of thyroid function studies: Secondary | ICD-10-CM | POA: Diagnosis not present

## 2023-08-03 DIAGNOSIS — E7800 Pure hypercholesterolemia, unspecified: Secondary | ICD-10-CM | POA: Diagnosis not present

## 2023-08-03 DIAGNOSIS — Z0000 Encounter for general adult medical examination without abnormal findings: Secondary | ICD-10-CM | POA: Diagnosis not present

## 2023-08-03 DIAGNOSIS — M1710 Unilateral primary osteoarthritis, unspecified knee: Secondary | ICD-10-CM | POA: Diagnosis not present

## 2023-08-03 DIAGNOSIS — M48062 Spinal stenosis, lumbar region with neurogenic claudication: Secondary | ICD-10-CM | POA: Diagnosis not present

## 2023-08-03 DIAGNOSIS — I10 Essential (primary) hypertension: Secondary | ICD-10-CM | POA: Diagnosis not present

## 2023-08-11 ENCOUNTER — Other Ambulatory Visit: Payer: Self-pay | Admitting: Family Medicine

## 2023-08-11 DIAGNOSIS — Z1231 Encounter for screening mammogram for malignant neoplasm of breast: Secondary | ICD-10-CM

## 2023-09-07 DIAGNOSIS — Z780 Asymptomatic menopausal state: Secondary | ICD-10-CM | POA: Diagnosis not present

## 2023-09-14 ENCOUNTER — Ambulatory Visit
Admission: RE | Admit: 2023-09-14 | Discharge: 2023-09-14 | Disposition: A | Discharge status: Home / Self Care | Payer: Medicare PPO | Source: Ambulatory Visit | Attending: Family Medicine | Admitting: Family Medicine

## 2023-09-14 DIAGNOSIS — Z1231 Encounter for screening mammogram for malignant neoplasm of breast: Secondary | ICD-10-CM

## 2023-11-23 DIAGNOSIS — Z01419 Encounter for gynecological examination (general) (routine) without abnormal findings: Secondary | ICD-10-CM | POA: Diagnosis not present

## 2024-02-14 DIAGNOSIS — R7303 Prediabetes: Secondary | ICD-10-CM | POA: Diagnosis not present

## 2024-02-14 DIAGNOSIS — M79674 Pain in right toe(s): Secondary | ICD-10-CM | POA: Diagnosis not present

## 2024-04-06 DIAGNOSIS — L659 Nonscarring hair loss, unspecified: Secondary | ICD-10-CM | POA: Diagnosis not present

## 2024-04-13 DIAGNOSIS — L659 Nonscarring hair loss, unspecified: Secondary | ICD-10-CM | POA: Diagnosis not present

## 2024-05-12 DIAGNOSIS — Z23 Encounter for immunization: Secondary | ICD-10-CM | POA: Diagnosis not present

## 2024-05-16 DIAGNOSIS — Z23 Encounter for immunization: Secondary | ICD-10-CM | POA: Diagnosis not present

## 2024-08-01 DIAGNOSIS — R7309 Other abnormal glucose: Secondary | ICD-10-CM | POA: Diagnosis not present

## 2024-08-01 DIAGNOSIS — E7800 Pure hypercholesterolemia, unspecified: Secondary | ICD-10-CM | POA: Diagnosis not present

## 2024-08-01 DIAGNOSIS — D709 Neutropenia, unspecified: Secondary | ICD-10-CM | POA: Diagnosis not present

## 2024-08-01 DIAGNOSIS — E559 Vitamin D deficiency, unspecified: Secondary | ICD-10-CM | POA: Diagnosis not present

## 2024-08-01 DIAGNOSIS — R946 Abnormal results of thyroid function studies: Secondary | ICD-10-CM | POA: Diagnosis not present

## 2024-08-03 ENCOUNTER — Other Ambulatory Visit: Payer: Self-pay | Admitting: Family Medicine

## 2024-08-03 DIAGNOSIS — Z1231 Encounter for screening mammogram for malignant neoplasm of breast: Secondary | ICD-10-CM

## 2024-09-14 ENCOUNTER — Ambulatory Visit

## 2024-09-14 ENCOUNTER — Ambulatory Visit
Admission: RE | Admit: 2024-09-14 | Discharge: 2024-09-14 | Disposition: A | Source: Ambulatory Visit | Attending: Family Medicine | Admitting: Family Medicine

## 2024-09-14 DIAGNOSIS — Z1231 Encounter for screening mammogram for malignant neoplasm of breast: Secondary | ICD-10-CM
